# Patient Record
Sex: Female | Born: 1945 | Race: White | Hispanic: No | Marital: Single | Smoking: Never smoker
Health system: Southern US, Community
[De-identification: ages and names within clinical notes are randomized; demographics above are authoritative.]

## PROBLEM LIST (undated history)

## (undated) DIAGNOSIS — F419 Anxiety disorder, unspecified: Secondary | ICD-10-CM

## (undated) DIAGNOSIS — K259 Gastric ulcer, unspecified as acute or chronic, without hemorrhage or perforation: Secondary | ICD-10-CM

## (undated) DIAGNOSIS — F29 Unspecified psychosis not due to a substance or known physiological condition: Secondary | ICD-10-CM

## (undated) DIAGNOSIS — J45909 Unspecified asthma, uncomplicated: Secondary | ICD-10-CM

## (undated) HISTORY — PX: STOMACH SURGERY: SHX791

## (undated) HISTORY — PX: FOOT SURGERY: SHX648

## (undated) HISTORY — PX: BUNIONECTOMY: SHX129

## (undated) HISTORY — PX: ABDOMINAL SURGERY: SHX537

## (undated) HISTORY — PX: HAMMER TOE SURGERY: SHX385

## (undated) HISTORY — PX: ABDOMINAL HYSTERECTOMY: SHX81

---

## 2003-06-28 DIAGNOSIS — F29 Unspecified psychosis not due to a substance or known physiological condition: Secondary | ICD-10-CM

## 2003-06-28 HISTORY — DX: Unspecified psychosis not due to a substance or known physiological condition: F29

## 2003-10-22 ENCOUNTER — Inpatient Hospital Stay (HOSPITAL_COMMUNITY): Admission: EM | Admit: 2003-10-22 | Discharge: 2003-11-23 | Payer: Self-pay | Admitting: Emergency Medicine

## 2003-10-23 ENCOUNTER — Encounter (INDEPENDENT_AMBULATORY_CARE_PROVIDER_SITE_OTHER): Payer: Self-pay | Admitting: *Deleted

## 2003-10-24 ENCOUNTER — Encounter (INDEPENDENT_AMBULATORY_CARE_PROVIDER_SITE_OTHER): Payer: Self-pay | Admitting: *Deleted

## 2003-11-23 ENCOUNTER — Inpatient Hospital Stay (HOSPITAL_COMMUNITY): Admission: AD | Admit: 2003-11-23 | Discharge: 2003-11-27 | Payer: Self-pay | Admitting: Psychiatry

## 2003-12-09 ENCOUNTER — Encounter (INDEPENDENT_AMBULATORY_CARE_PROVIDER_SITE_OTHER): Payer: Self-pay | Admitting: *Deleted

## 2003-12-19 ENCOUNTER — Encounter (INDEPENDENT_AMBULATORY_CARE_PROVIDER_SITE_OTHER): Payer: Self-pay | Admitting: *Deleted

## 2004-01-26 ENCOUNTER — Ambulatory Visit (HOSPITAL_COMMUNITY): Admission: RE | Admit: 2004-01-26 | Discharge: 2004-01-26 | Payer: Self-pay | Admitting: Gastroenterology

## 2004-01-26 ENCOUNTER — Encounter (INDEPENDENT_AMBULATORY_CARE_PROVIDER_SITE_OTHER): Payer: Self-pay | Admitting: *Deleted

## 2005-06-15 IMAGING — CT CT PELVIS W/ CM
1 of 4 series · 14 of 32 positions shown, 19 images · IV contrast (omnipaque)
Comparison: none

CLINICAL DATA: 67 year-old left upper quadrant abdominal pain.
 CT ABDOMEN AND PELVIS
 Helical CT scan of the abdomen and pelvis is performed after bolus infusion of a total of 150 cc Omnipaque 300 and the use of dilute oral contrast.
 The lung bases are clear. There is a small Bochdalek type hernia noted on the left side posteriorly.
 ABDOMEN
 There is marked distention of the stomach.  There are clips near the GE junction and I suspect the patient has had a Nissen fundoplication. There is marked inflammatory change involving the duodenum and I suspect there is an ulcer with inflammation causing gastric outlet obstruction.  The pancreas appears normal and the second and third portion of the duodenum appears normal.  The liver and spleen are normal in appearance.  The adrenal glands and kidneys are normal. The aorta is normal in caliber and the major branch vessels are intact.  The gallbladder appears normal. No mesenteric or retroperitoneal masses or adenopathy. Small bowel and colon are grossly normal.
 IMPRESSION
 Marked distention of the stomach with inflammatory change surrounding the region of the duodenal bulb and descending duodenum. The findings are suspicious for an ulcer with inflammation causing gastric outlet obstruction.  No findings to suggest a perforation.
 PELVIS
 The patient has had a hysterectomy.  There is scattered diverticula involving the colon.  No significant pelvic findings.
 Unremarkable CT pelvis.

[Series 2: abd/pelvis 5.0 b30f · axial · 0.61mm/px · z∈[-444,-34]mm · 14 of 94 slices shown, 19 images]
[im 6/94  soft-tissue]
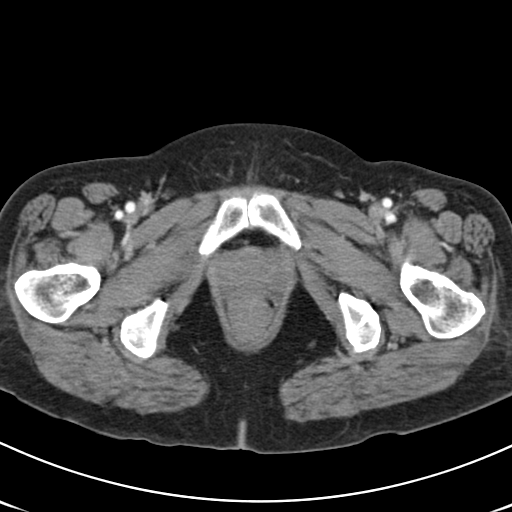
[im 6/94  bone]
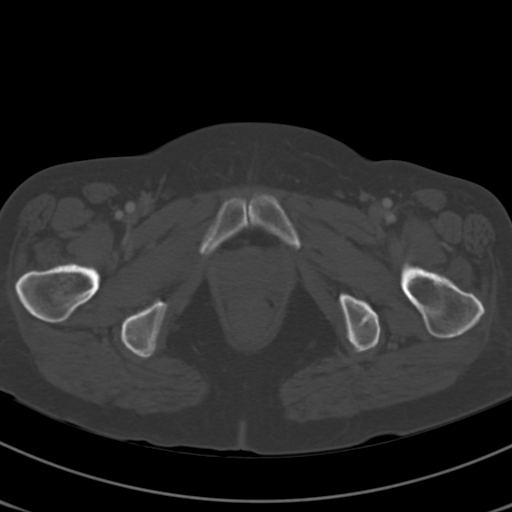
[im 11/94  soft-tissue]
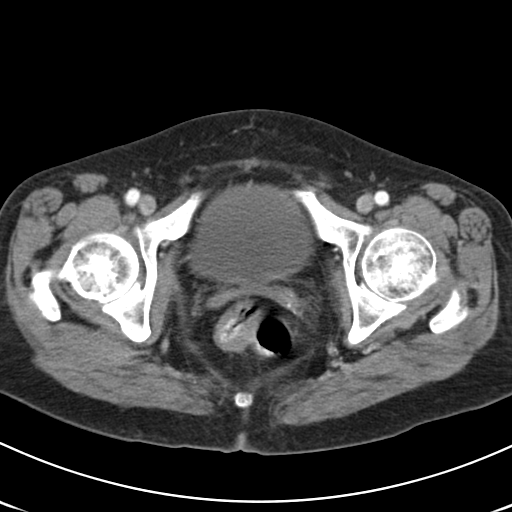
[im 22/94  soft-tissue]
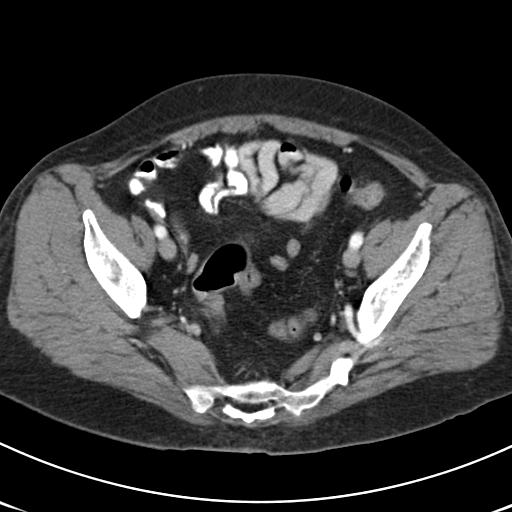
[im 28/94  soft-tissue]
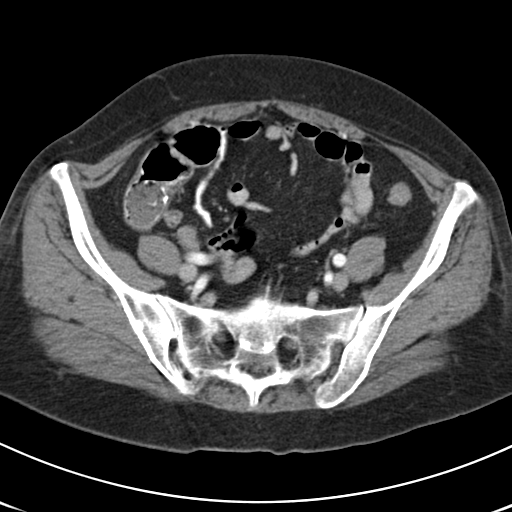
[im 33/94  soft-tissue]
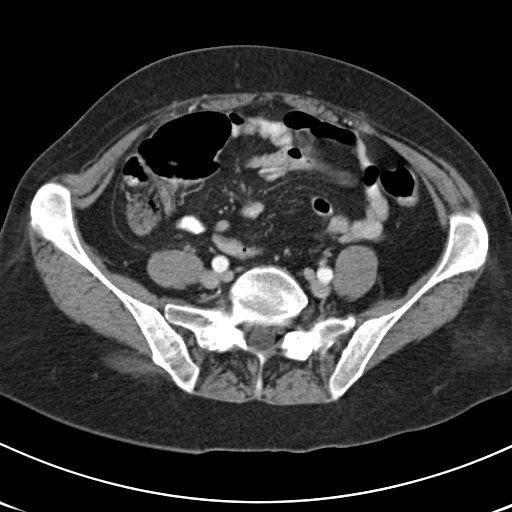
[im 39/94  soft-tissue]
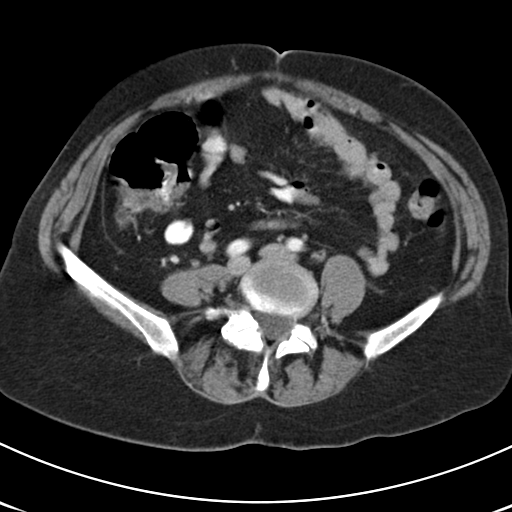
[im 50/94  soft-tissue]
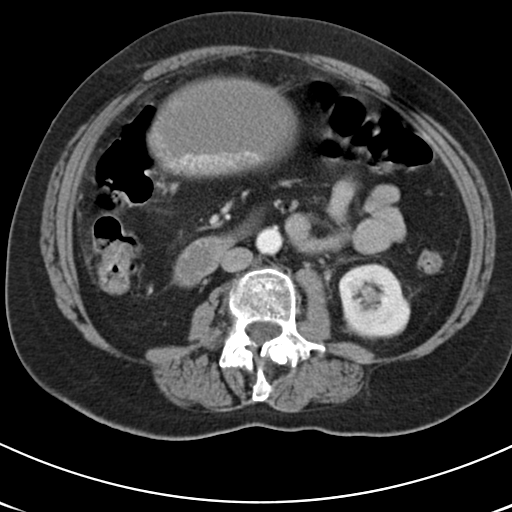
[im 55/94  soft-tissue]
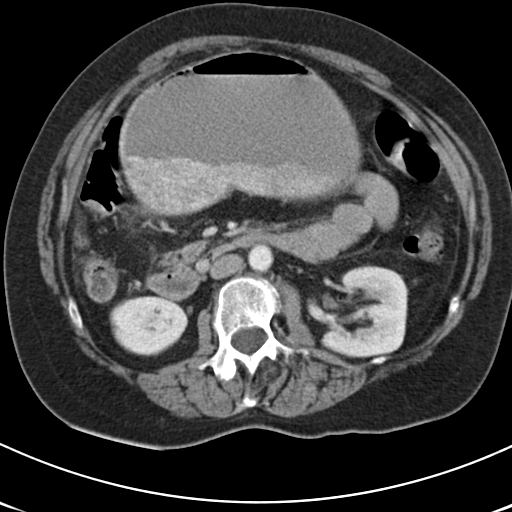
[im 61/94  soft-tissue]
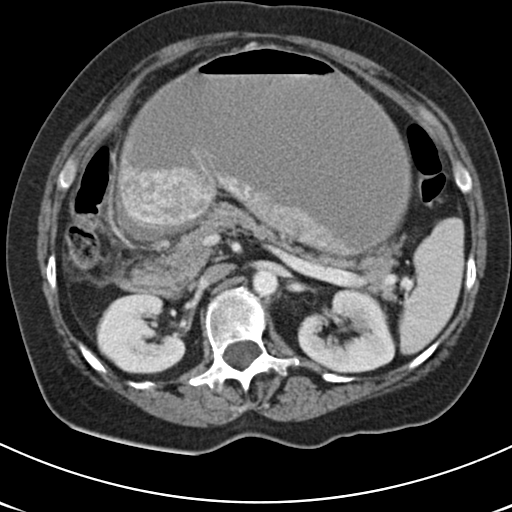
[im 61/94  bone]
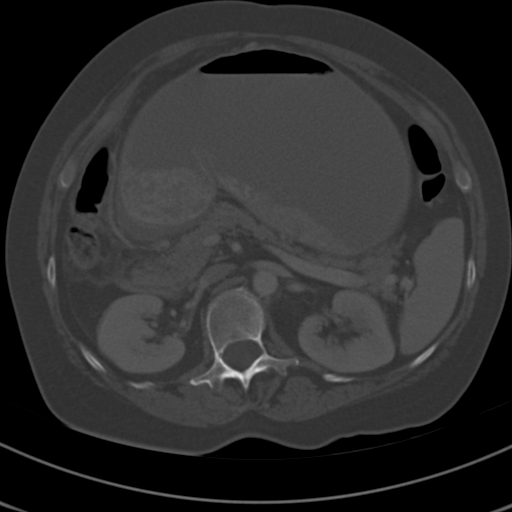
[im 66/94  soft-tissue]
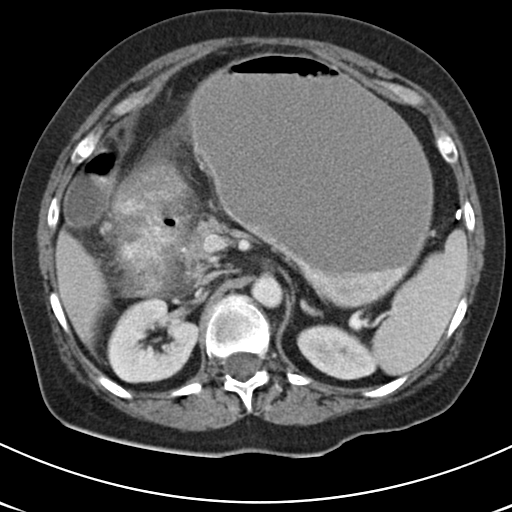
[im 72/94  soft-tissue]
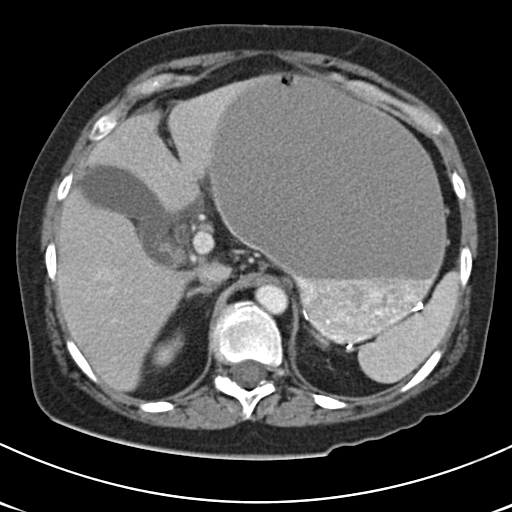
[im 72/94  lung]
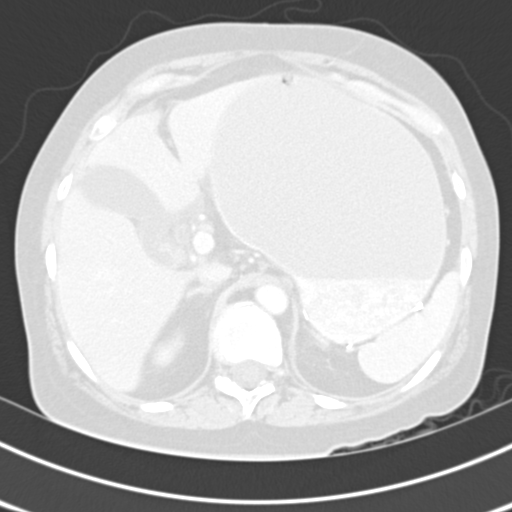
[im 77/94  lung]
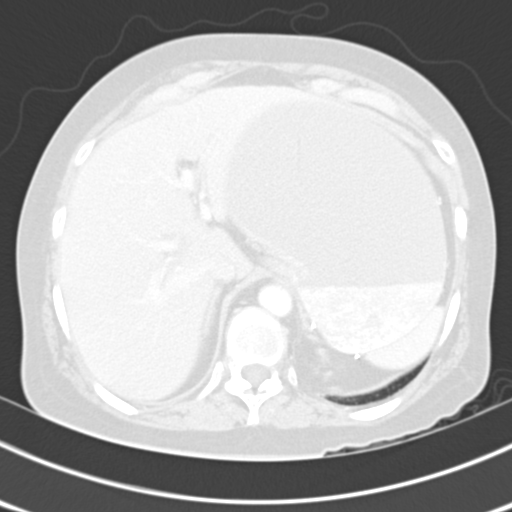
[im 83/94  soft-tissue]
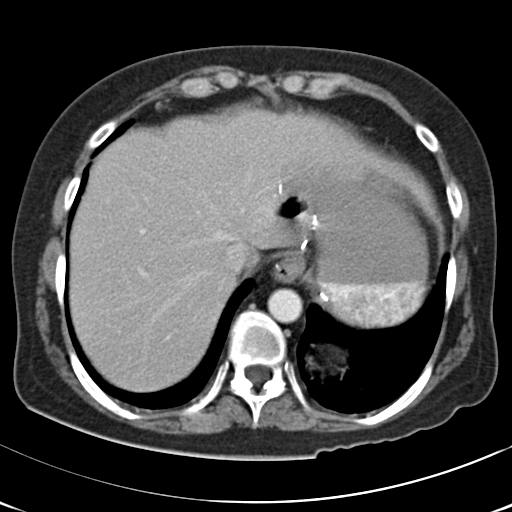
[im 83/94  lung]
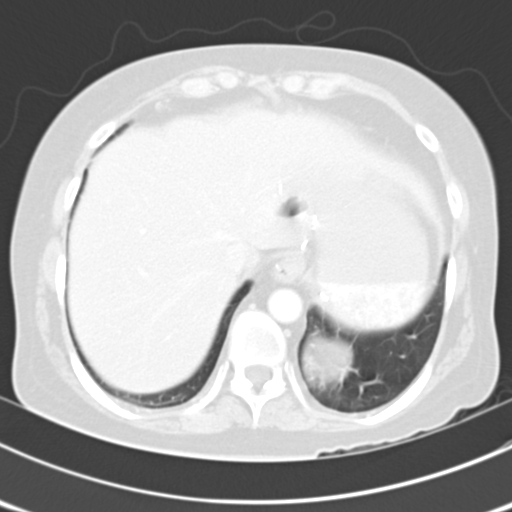
[im 88/94  soft-tissue]
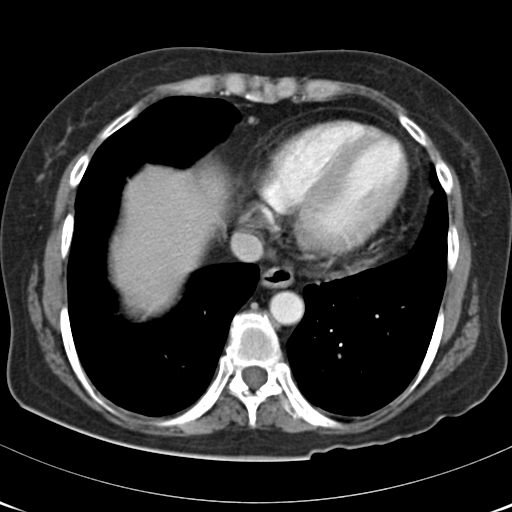
[im 88/94  lung]
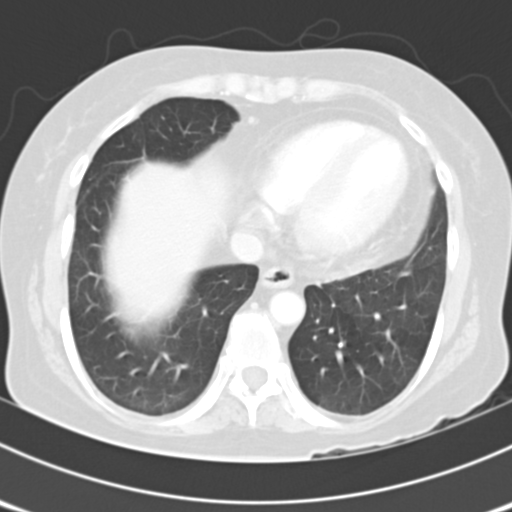

[14 of 32 positions shown; findings below may reference images not displayed]

## 2008-12-02 ENCOUNTER — Telehealth: Payer: Self-pay | Admitting: Internal Medicine

## 2009-07-08 ENCOUNTER — Ambulatory Visit (HOSPITAL_COMMUNITY): Admission: RE | Admit: 2009-07-08 | Discharge: 2009-07-08 | Payer: Self-pay | Admitting: General Practice

## 2010-11-09 ENCOUNTER — Other Ambulatory Visit (HOSPITAL_COMMUNITY): Payer: Self-pay | Admitting: Obstetrics and Gynecology

## 2010-11-09 DIAGNOSIS — Z1231 Encounter for screening mammogram for malignant neoplasm of breast: Secondary | ICD-10-CM

## 2010-11-09 DIAGNOSIS — Z13820 Encounter for screening for osteoporosis: Secondary | ICD-10-CM

## 2010-11-12 NOTE — Discharge Summary (Signed)
NAMESHAMICKA, INGA                           ACCOUNT NO.:  [id number]   MEDICAL RECORD NO.:  [id number]                   PATIENT TYPE:  INP   LOCATION:  0457                                 FACILITY:  [facility]   PHYSICIAN:  Velora Heckler, M.D.                DATE OF BIRTH:  10/20/1945   DATE OF ADMISSION:  10/22/2003  DATE OF DISCHARGE:  11/23/2003                                 DISCHARGE SUMMARY   REASON FOR ADMISSION:  Nausea, vomiting, abdominal pain.   BRIEF HISTORY:  Patient is a 65 year old white female admitted on October 22, 2003 on the medical service of Dr. Derenda Mis for nausea, vomiting, and  abdominal pain.  Patient had a three-day history of emesis.  She was unable  to tolerate clear liquids.  She was admitted on the medical service.  Radiograph studies showed gastric outlet obstruction and a large acute  duodenal ulcer.  Patient was seen by Dr. Carman Ching and taken to  endoscopy.  She was found to have a massive duodenal ulcer.  Post procedure,  the patient had abdominal distention and increased abdominal pain.  Plain  abdominal x-rays showed a large amount of free air.  General surgery was  consulted, and the patient was prepared for the operating room.   HOSPITAL COURSE:  The patient was taken to the operating room on the evening  of October 24, 2003.  She was found to have a perforated giant duodenal ulcer  and gastric outlet obstruction.  She underwent exploratory laparotomy with  lysis of adhesions.  She had a pyloric exclusion performed with a staple  line across the antrum.  She underwent gastrojejunostomy.  She had Cheree Ditto  patch closure of the duodenal ulcer.   Postoperative course was complicated by a persistent leak at the site of the  duodenal ulcer.  Patient required nasogastric decompression and prolonged  n.p.o. status with administration of parenteral hyperalimentation.  She  received a prolonged course of IV antibiotics.  She was followed closely  by  the medical service as well as general surgery.  Due to some delusion  ideation and frank psychosis, patient was seen in consultation by Dr.  Jeanie Sewer from the psychiatric service.   The patient made steady progress.  Her drains were eventually removed.  She  did develop a wound infection which required opening of the abdominal  incision and an open packing with dressing changes.  The patient was  followed with a CT scan of the abdomen.  She developed a recurrence of a  fluid collection in the upper abdomen that required percutaneous drainage by  the radiologist.  This led to marked improvement of her abdominal pain and  normalization of her white blood cell count.  She continued on a course of  IV Cipro and Flagyl until removal of all drains was completed.  The patient  continued to make steady progress.  She was ambulatory.  She tolerated a  diet.  She was discharged on Nov 23, 2003 to the inpatient psychiatry  service with involuntary commitment papers by Dr. Jeanie Sewer.  She was taken  to [facility] on Sunday, Nov 23, 2003.   DISCHARGE PLAN:  Patient is admitted for inpatient psychiatric care at  [facility] on Nov 23, 2003.  Discharge medications including Protonix  40 mg b.i.d., Vicodin as needed for pain, Cipro, and Flagyl for one week.  Patient will be seen back at my office at [facility] in two  weeks.   FINAL DIAGNOSIS:  Perforated giant duodenal ulcer, gastric outlet  obstruction.   CONDITION ON DISCHARGE:  Improved.                                               Velora Heckler, M.D.    TMG/MEDQ  D:  12/19/2003  T:  12/19/2003  Job:  [id number]
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
## 2010-11-12 NOTE — Op Note (Signed)
Molly [name], Molly [name]                           ACCOUNT NO.:  [id number]   MEDICAL RECORD NO.:  [id number]                   PATIENT TYPE:  INP   LOCATION:  0457                                 FACILITY:  [facility]   PHYSICIAN:  James L. Malon Kindle., M.D.          DATE OF BIRTH:  1946-04-12   DATE OF PROCEDURE:  10/24/2003  DATE OF DISCHARGE:                                 OPERATIVE REPORT   PROCEDURE:  Esophagogastroduodenoscopy with biopsy.   INDICATIONS FOR PROCEDURE:  Nausea and vomiting with current ulcer with  outlet obstruction on CT.   DESCRIPTION OF PROCEDURE:  The procedure had been explained to the patient  and consent obtained.  The patient had an NG tube and has been undergoing NG  suction for several days.  This procedure is done to look for a source of  her outlet obstruction.   DESCRIPTION OF PROCEDURE:  The procedure had been explained to the patient  and consent obtained. The scope was inserted, I elected to leave the NG tube  in place and followed it down, entered the stomach. A large amount of food  material was still in the stomach. We identified the gastric outlet and  looking through the gastric outlet there was food material and a large  ulcer. I did irrigate this some and photographed it. I did not try to push  through this.  We reviewed this only in the key hole position through the  pylorus. This ulcer was massive and seemed to involve the entire anterior  duodenal wall. The scope was withdrawn back in the stomach, a biopsy was  taken for rapid urase test for helicobacter.  The scope was withdrawn, the  NG tube was left in place. The stomach could not otherwise be examined due  to a large amount of liquid food material that was suctioned as much as  possible. The distal and proximal esophagus were endoscopically normal.  The  patient tolerated the procedure relatively well and was resting comfortably  at the termination of the procedure.   ASSESSMENT:   Massive duodenal ulcer with gastric outlet obstruction.   PLAN:  Will continue NG suction and IV PPI's, possibly an upper GI series in  the next several days.                                               James L. Malon Kindle., M.D.    [name]  D:  10/24/2003  T:  10/24/2003  Job:  [id number]   cc:   Sammuel Bailiff, M.D.  [facility] [employer] [facility]

## 2010-11-12 NOTE — Op Note (Signed)
Molly [name], Molly [name]                           ACCOUNT NO.:  [id number]   MEDICAL RECORD NO.:  [id number]                   PATIENT TYPE:  INP   LOCATION:  0457                                 FACILITY:  [facility]   PHYSICIAN:  Velora Heckler, M.D.                DATE OF BIRTH:  [birth year]   DATE OF PROCEDURE:  10/24/2003  DATE OF DISCHARGE:                                 OPERATIVE REPORT   PREOPERATIVE DIAGNOSES:  1. Perforated giant duodenal ulcer.  2. Gastric outlet obstruction.   POSTOPERATIVE DIAGNOSES:  1. Perforated giant duodenal ulcer.  2. Gastric outlet obstruction.   PROCEDURES:  1. Exploratory laparotomy with lysis of adhesions.  2. Pyloric exclusion.  3. Gastrojejunostomy.  4. Graham patch closure, duodenal ulcer.   SURGEON:  Velora Heckler, M.D.   ASSISTANT:  Leonie Man, M.D.   ANESTHESIA:  General.   ESTIMATED BLOOD LOSS:  100 mL.   PREPARATION:  Betadine.   COMPLICATIONS:  None.   INDICATIONS:  The patient is a 65 year old white female, admitted on the  medical service with abdominal pain, nausea, vomiting.  CT scan showed  massively dilated stomach with gastric outlet obstruction.  The patient was  decompressed and then underwent upper endoscopy by Dr. Carman Ching.  This  was performed on the day of surgery.  Postprocedure, the patient was noted  to have abdominal distention and pain.  Abdominal plain x-rays demonstrate a  large amount of free air in the right upper quadrant consistent with  perforation.  General surgery was consulted, and the patient was prepared  urgently for the operating room.   DESCRIPTION OF PROCEDURE:  The procedure is done in OR #1 at the [facility].  The patient is brought to the operating room and placed  in a supine position on the operating room table.  Following the  administration of general anesthesia, the patient was prepped and draped in  the usual strict aseptic fashion.  After ascertaining  that an adequate level  of anesthesia had been obtained, a midline abdominal incision is made  through the previous scar using a #10 blade.  Dissection is carried through  subcutaneous tissues.  Fascia is incised in the midline, and the peritoneal  cavity is entered cautiously.  Adhesions are lysed extensively from the  anterior abdominal wall.  A Balfour retractor is placed for exposure.  Small  bowel adhesions are lysed from ligament of Treitz to ileocecal valve.  There  is a copious amount of cloudy fluid throughout the peritoneal cavity and  especially in the pelvis.  This was evacuated.  Upper abdomen is explored.  The falciform ligament is taken down.  Adhesions are lysed along the liver  to expose the stomach and pylorus.  Gallbladder is distended.  Liver appears  grossly normal.  Exploration of the pyloric channel shows a large ulcer  which is perforated  with a maximum diameter of approximately 2.5 cm.  There  is vegetative material spilled in Morison's pouch.  This is extracted.  There is a fibrinous exudate coating Morison's pouch and the undersurface of  the liver and the duodenal sweep.  This is gently debrided.  The duodenal C-  loop is markedly inflamed and encased in inflammatory tissue.  The ulcer  itself is markedly thick-walled and friable.  Pylorus appears to be  hypertrophic, dense, with a dense inflammatory reaction surrounding it.  Decision is made not to perform simply patch closure.  Pyloroplasty does not  appear possible due to the inflammatory nature of the tissues.  Therefore, a  decision is made to proceed with pyloric exclusion and gastrojejunostomy for  treatment of perforated giant duodenal ulcer and gastric outlet obstruction.  The gastrocolic ligament is incised.  Vessels are taken down along the  greater curvature of the stomach towards the pylorus between Kelly clamps  and ligated with 2-0 silk ties.  This exposes the posterior aspect of the  stomach.  The  dissection is carried down to near the pylorus.  Using a TA 90  stapler with 4.8 mm staples, a staple line is placed across the distal  antrum approximately 3 cm proximal to the pylorus.  This appears to occlude  the distal antrum completely.  Next, an opening is made in the mesocolon,  and a loop of proximal jejunum approximately 40-50 cm from the ligament of  Treitz is brought retrocolic into the upper abdomen.  A gastrojejunostomy is  then created between the posterior wall of the dependent portion of the  stomach and the jejunum.  This was performed with a GIA stapler.  Staple  line is inspected for hemostasis.  Enterotomy is closed with the TA 90  stapler using a 3.5 mm staple cartridge.  Staple line is oversewn with  interrupted 2-0 silk Lembert sutures.  Good hemostasis was noted.  The  efferent and afferent limbs appear to be widely patent.  Next, the abdomen  was irrigated copiously with warm saline.  This was evacuated.  Two 10  [place] Blake drains are brought in through stab wounds in the right upper  quadrant of the abdominal wall.  The lateral drain is placed into Morison's  pouch behind the ulcer.  Next, the ulcer is closed with a Cheree Ditto patch  closure using 2-0 silk sutures through the ulcer wall, securing omentum over  the ulcer cavity.  The second drain is then placed anterior to the [facility]  patch closure beneath the edge of the liver.  Drains are secured to the skin  with 3-0 nylon sutures.  All packs are removed.  Good hemostasis is noted.  Midline wound is closed with interrupted #1 Novofil figure-of-eight sutures.  Subcutaneous tissues are irrigated.  Skin is closed with stainless steel  staples.  Sterile dressings are applied.  The patient is awakened from  anesthesia and brought to the recovery room in stable condition.  The  patient tolerated the procedure well.                                              Velora Heckler, M.D.    TMG/MEDQ  D:  10/24/2003  T:   10/24/2003  Job:  [id number]   cc:   Sherin Quarry, MD   Schuyler Amor, M.D.  [phone]  [place], [place] [address]  Fax: [phone]
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
## 2010-11-12 NOTE — Consult Note (Signed)
NAMESHANTAE, VANTOL                           ACCOUNT NO.:  [id number]   MEDICAL RECORD NO.:  [id number]                   PATIENT TYPE:  INP   LOCATION:  0457                                 FACILITY:  [facility]   PHYSICIAN:  Velora Heckler, M.D.                DATE OF BIRTH:  04-Mar-1946   DATE OF CONSULTATION:  10/24/2003  DATE OF DISCHARGE:                                   CONSULTATION   CONSULTING PHYSICIAN:  Velora Heckler, M.D.   REFERRING PHYSICIAN:  Sherin Quarry, M.D. of [facility].   REASON FOR CONSULTATION:  Acute abdomen, perforated duodenal ulcer, gastric  outlet obstruction.   HISTORY OF PRESENT ILLNESS:  The patient is a 65 year old white female who  was admitted on October 22, 2003 on the medical service by Dr. Derenda Mis  for nausea, vomiting and abdominal pain. The patient had a three day history  of vomiting.  She had an inability to tolerate clear liquids.  Despite  multiple home therapeutic remedies, she had no relief.  She was admitted on  the medical service.  Radiographic studies demonstrated findings consistent  with gastric outlet obstruction and a large acute duodenal ulcer.  The  patient was seen in consultation by gastroenterology.  On April 29, she was  taken to the endoscopy suite where she underwent upper endoscopy by Dr.  Carman Ching.  She was found to have a massive duodenal ulcer.  Post  procedure she was noted to have increased abdominal pain and abdominal  distention.  Plain abdominal x-rays showed a moderate to large amount of  free air in the right upper quadrant of the abdomen consistent with  perforation.  The patient is now seen by general surgery for management of  acute perforation of duodenal ulcer.   PAST MEDICAL HISTORY:  1. Status post open Nissen fundoplication by Dr. Wenda Low in     approximately 1990.  2. History of asthma.   HOME MEDICATIONS:  1. Stool softeners.  2. Advair.  3. Albuterol.  4. Atrovent.  5.  Singulair.  6. Xanax.   ALLERGIES:  1. PENICILLIN.  2. SULFA.  3. TOPICAL COCAINE.   SOCIAL HISTORY:  The patient is not married.  She lives in [place].  She  works as a Conservation officer, nature at [employer].  She denies tobacco use.  She  denies alcohol use.   FAMILY HISTORY:  Notable for diabetes in the patient's father.  History of  cancer of unknown type in the patient's mother.   REVIEW OF SYMPTOMS:  The fifteen system review is without significant other  positives.  GI:  The patient denies any previous history of ulcer disease.  She denies any GI bleeding symptoms. She denies any previous abdominal pain.   PHYSICAL EXAMINATION:  GENERAL:  [age]-year-old awake white female on  [address] at [employer].  VITAL SIGNS:  Temperature  is 100.7, temperature maximum 101.0, pulse 92,  respirations 32, blood pressure 144/89.  HEENT:  Shows her to be normocephalic, atraumatic.  Sclerae are clear.  Conjunctivae are clear.  Pupils are equal and reactive.  Dentition is fair.  A nasogastric tube is in the right nares.  Palpation of the neck shows the  thyroid to be normal without nodularity.  There is no anterior or posterior  cervical lymphadenopathy.  There are no supraclavicular masses.  LUNGS:  Clear to auscultation without rales or wheezes.  CARDIAC:  Mild tachycardia. There is no murmur.  Peripheral pulses are full.  ABDOMEN:  Soft, moderately distended. On auscultation there are no bowel  sounds.  There is diffuse abdominal tenderness both to palpation and  percussion.  There is rebound tenderness.  There is voluntary guarding.  There is a well healed upper midline surgical wound without sign of  herniation.  There are no palpable masses.  EXTREMITIES: Non-tender without edema.  No evidence of recent trauma and no  ecchymosis.  NEUROLOGIC:  The patient is alert and oriented to person, place and time  without focal deficit.   LABORATORY DATA:  White count 12.6,  hemoglobin 10.5, platelets 336,000.  Electrolytes are normal.   RADIOGRAPHIC STUDIES:  CT scan of the abdomen and pelvis as well as today's  plain abdominal x-rays are reviewed with Dr. Loralie Champagne in the radiology  department.   IMPRESSION:  1. Perforated duodenal ulcer following upper endoscopy.  2. Gastric outlet obstruction.  3. History of anti-reflux procedure.  4. History of asthma.   PLAN:  1. Immediate preparation for operating room for urgent laparotomy.  2. Intravenous antibiotics per medical service.  3. NPO, nasogastric decompression.   The patient will require an exploratory laparotomy for an acute abdomen with  perforation and a large amount of free air.  Given the appearance of the  duodenum on both endoscopy and CT scan, it is doubtful that the patient can  have  a simple Cheree Ditto patch procedure.  It is also doubtful that a pyloroplasty  will be possible.  Therefore, the patient will likely require pyloric  exclusion with gastrojejunostomy.  I have discussed this both with Dr. Sherin Quarry and with the patient.  She understands and wishes to proceed.                                               Velora Heckler, M.D.    TMG/MEDQ  D:  10/24/2003  T:  10/24/2003  Job:  [id number]   cc:   Sherin Quarry, MD   Llana Aliment Malon Kindle., M.D.  [address]  [place]  [place] [address]  Fax: [phone]

## 2010-11-12 NOTE — Op Note (Signed)
NAMEROCKELLE, HEUERMAN                           ACCOUNT NO.:  [id number]   MEDICAL RECORD NO.:  [id number]                   PATIENT TYPE:  AMB   LOCATION:  ENDO                                 FACILITY:  [facility]   PHYSICIAN:  James L. Malon Kindle., M.D.          DATE OF BIRTH:  1945-07-27   DATE OF PROCEDURE:  01/26/2004  DATE OF DISCHARGE:                                 OPERATIVE REPORT   PROCEDURE:  Esophagogastroduodenoscopy.   MEDICATIONS:  1. Cetacaine spray.  2. Fentanyl 75 mcg.  3. Versed 7 mg IV.   INDICATION:  The patient came in with upper abdominal pain and had endoscopy  showing a perforated ulcer and underwent repair of this, had peritonitis,  had a long, rocky hospital course including a brief hospitalization at  [facility], has gradually improved, but she is still having  epigastric pain.  For this reason, an endoscopy is performed to see if there  is any active ulceration.   DESCRIPTION OF PROCEDURE:  The procedure had been explained to the patient  and consent obtained.  In the left lateral decubitus position, the scope was  inserted and advanced with agglutination.  The esophagus was entered, the  scope passed down in the stomach.  The gastric outlet was identified and was  completely socked in and obstructed.  There was really no opening at all  with insufflation of air.  The stomach was examined and along the greater  curve was a Roux-en-Y loop.  This was entered, and the limb was normal.  There was no ulceration.  There was marked erosions and inflammation around  in the stomach.  No active ulceration.  The fundus and cardia were seen well  under retroflexed view and were normal.  The scope was withdrawn.  The  distal and proximal esophagus are endoscopically normal.  The scope was  withdrawn, and the patient tolerated the procedure well.   ASSESSMENT:  Gastritis, possibly the cause of abdominal pain.  No active  ulceration to explain the abdominal  pain.  535.00 and 789.06.   PLAN:  We will continue on Protonix, see back in the office in 6-8 weeks.                                               James L. Malon Kindle., M.D.    [name]  D:  01/26/2004  T:  01/27/2004  Job:  [id number]   cc:   Velora Heckler, M.D.  [address]  [place]  [place] [address]  Fax: [phone]   Schuyler Amor, M.D.  [address]  [place], [place] [address]  Fax: [phone]
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
## 2010-11-18 ENCOUNTER — Ambulatory Visit (HOSPITAL_COMMUNITY)
Admission: RE | Admit: 2010-11-18 | Discharge: 2010-11-18 | Disposition: A | Discharge status: Home / Self Care | Payer: 59 | Source: Ambulatory Visit | Attending: Obstetrics and Gynecology | Admitting: Obstetrics and Gynecology

## 2010-11-18 ENCOUNTER — Other Ambulatory Visit (HOSPITAL_COMMUNITY): Payer: Self-pay | Admitting: Obstetrics and Gynecology

## 2010-11-18 DIAGNOSIS — Z13820 Encounter for screening for osteoporosis: Secondary | ICD-10-CM | POA: Insufficient documentation

## 2010-11-18 DIAGNOSIS — Z1231 Encounter for screening mammogram for malignant neoplasm of breast: Secondary | ICD-10-CM

## 2010-11-18 DIAGNOSIS — Z780 Asymptomatic menopausal state: Secondary | ICD-10-CM | POA: Insufficient documentation

## 2013-01-04 ENCOUNTER — Encounter (HOSPITAL_COMMUNITY): Payer: Self-pay | Admitting: Emergency Medicine

## 2013-01-04 ENCOUNTER — Emergency Department (HOSPITAL_COMMUNITY)
Admission: EM | Admit: 2013-01-04 | Discharge: 2013-01-04 | Discharge status: Against Medical Advice | Payer: Medicare Other | Source: Home / Self Care

## 2013-01-04 HISTORY — DX: Unspecified asthma, uncomplicated: J45.909

## 2013-01-04 HISTORY — DX: Gastric ulcer, unspecified as acute or chronic, without hemorrhage or perforation: K25.9

## 2013-01-04 NOTE — ED Notes (Signed)
Pt reports she is out of her Albuterol and feels tightness is her chest. Mild wheezing. Last breathing tx was last night. Usually does 2-3 per day. Just turned 67 and switched to Medicare and her PCP no longer accepts Medicare. Needs a refill until she establishes care with a new PCP. Patient is alert and oriented.

## 2013-04-18 ENCOUNTER — Other Ambulatory Visit (HOSPITAL_COMMUNITY): Payer: Self-pay | Admitting: *Deleted

## 2013-04-18 DIAGNOSIS — Z0000 Encounter for general adult medical examination without abnormal findings: Secondary | ICD-10-CM

## 2013-05-08 ENCOUNTER — Ambulatory Visit (HOSPITAL_COMMUNITY)
Admission: RE | Admit: 2013-05-08 | Discharge: 2013-05-08 | Disposition: A | Discharge status: Home / Self Care | Payer: Medicare Other | Source: Ambulatory Visit | Attending: *Deleted | Admitting: *Deleted

## 2013-05-08 DIAGNOSIS — Z1231 Encounter for screening mammogram for malignant neoplasm of breast: Secondary | ICD-10-CM | POA: Insufficient documentation

## 2013-05-08 DIAGNOSIS — Z0000 Encounter for general adult medical examination without abnormal findings: Secondary | ICD-10-CM

## 2013-07-25 ENCOUNTER — Encounter (HOSPITAL_COMMUNITY): Payer: Self-pay | Admitting: Emergency Medicine

## 2013-07-25 ENCOUNTER — Emergency Department (HOSPITAL_COMMUNITY)
Admission: EM | Admit: 2013-07-25 | Discharge: 2013-07-26 | Disposition: A | Discharge status: Other Acute Inpatient Hospital | Payer: Medicare Other | Attending: Emergency Medicine | Admitting: Emergency Medicine

## 2013-07-25 DIAGNOSIS — J45909 Unspecified asthma, uncomplicated: Secondary | ICD-10-CM | POA: Insufficient documentation

## 2013-07-25 DIAGNOSIS — Z79899 Other long term (current) drug therapy: Secondary | ICD-10-CM | POA: Insufficient documentation

## 2013-07-25 DIAGNOSIS — Z8711 Personal history of peptic ulcer disease: Secondary | ICD-10-CM | POA: Insufficient documentation

## 2013-07-25 DIAGNOSIS — F411 Generalized anxiety disorder: Secondary | ICD-10-CM | POA: Insufficient documentation

## 2013-07-25 DIAGNOSIS — IMO0002 Reserved for concepts with insufficient information to code with codable children: Secondary | ICD-10-CM | POA: Insufficient documentation

## 2013-07-25 DIAGNOSIS — F22 Delusional disorders: Secondary | ICD-10-CM | POA: Insufficient documentation

## 2013-07-25 DIAGNOSIS — F29 Unspecified psychosis not due to a substance or known physiological condition: Secondary | ICD-10-CM | POA: Insufficient documentation

## 2013-07-25 DIAGNOSIS — Z880 Allergy status to penicillin: Secondary | ICD-10-CM | POA: Insufficient documentation

## 2013-07-25 HISTORY — DX: Unspecified psychosis not due to a substance or known physiological condition: F29

## 2013-07-25 HISTORY — DX: Anxiety disorder, unspecified: F41.9

## 2013-07-25 LAB — CBC WITH DIFFERENTIAL/PLATELET
BASOS ABS: 0 10*3/uL (ref 0.0–0.1)
Basophils Relative: 0 % (ref 0–1)
EOS ABS: 0.1 10*3/uL (ref 0.0–0.7)
EOS PCT: 1 % (ref 0–5)
HCT: 38 % (ref 36.0–46.0)
Hemoglobin: 12.4 g/dL (ref 12.0–15.0)
Lymphocytes Relative: 13 % (ref 12–46)
Lymphs Abs: 1 10*3/uL (ref 0.7–4.0)
MCH: 29.8 pg (ref 26.0–34.0)
MCHC: 32.6 g/dL (ref 30.0–36.0)
MCV: 91.3 fL (ref 78.0–100.0)
Monocytes Absolute: 0.4 10*3/uL (ref 0.1–1.0)
Monocytes Relative: 6 % (ref 3–12)
Neutro Abs: 6.3 10*3/uL (ref 1.7–7.7)
Neutrophils Relative %: 81 % — ABNORMAL HIGH (ref 43–77)
PLATELETS: 331 10*3/uL (ref 150–400)
RBC: 4.16 MIL/uL (ref 3.87–5.11)
RDW: 12.7 % (ref 11.5–15.5)
WBC: 7.8 10*3/uL (ref 4.0–10.5)

## 2013-07-25 LAB — RAPID URINE DRUG SCREEN, HOSP PERFORMED
Amphetamines: NOT DETECTED
Barbiturates: NOT DETECTED
Benzodiazepines: NOT DETECTED
Cocaine: NOT DETECTED
Opiates: NOT DETECTED
TETRAHYDROCANNABINOL: NOT DETECTED

## 2013-07-25 LAB — URINALYSIS, ROUTINE W REFLEX MICROSCOPIC
BILIRUBIN URINE: NEGATIVE
GLUCOSE, UA: NEGATIVE mg/dL
Hgb urine dipstick: NEGATIVE
Ketones, ur: NEGATIVE mg/dL
Leukocytes, UA: NEGATIVE
Nitrite: NEGATIVE
PH: 6 (ref 5.0–8.0)
Protein, ur: NEGATIVE mg/dL
Specific Gravity, Urine: 1.015 (ref 1.005–1.030)
UROBILINOGEN UA: 0.2 mg/dL (ref 0.0–1.0)

## 2013-07-25 LAB — BASIC METABOLIC PANEL
BUN: 8 mg/dL (ref 6–23)
CALCIUM: 9.6 mg/dL (ref 8.4–10.5)
CO2: 27 mEq/L (ref 19–32)
Chloride: 101 mEq/L (ref 96–112)
Creatinine, Ser: 0.6 mg/dL (ref 0.50–1.10)
GLUCOSE: 115 mg/dL — AB (ref 70–99)
POTASSIUM: 4 meq/L (ref 3.7–5.3)
SODIUM: 141 meq/L (ref 137–147)

## 2013-07-25 LAB — ETHANOL

## 2013-07-25 MED ORDER — HYDROCHLOROTHIAZIDE 12.5 MG PO CAPS
12.5000 mg | ORAL_CAPSULE | Freq: Every day | ORAL | Status: DC
  Administered 2013-07-26: 12.5 mg via ORAL
  Filled 2013-07-25 (×3): qty 1

## 2013-07-25 MED ORDER — FLUTICASONE PROPIONATE HFA 44 MCG/ACT IN AERO
2.0000 | INHALATION_SPRAY | Freq: Two times a day (BID) | RESPIRATORY_TRACT | Status: DC
  Administered 2013-07-26: 2 via RESPIRATORY_TRACT
  Filled 2013-07-25: qty 10.6

## 2013-07-25 MED ORDER — LOSARTAN POTASSIUM 50 MG PO TABS
100.0000 mg | ORAL_TABLET | Freq: Every day | ORAL | Status: DC
  Administered 2013-07-26: 100 mg via ORAL
  Filled 2013-07-25 (×3): qty 2

## 2013-07-25 MED ORDER — LOSARTAN POTASSIUM-HCTZ 100-12.5 MG PO TABS: 1.0000 | ORAL_TABLET | Freq: Every day | ORAL | Status: DC

## 2013-07-25 MED ORDER — ALBUTEROL SULFATE (2.5 MG/3ML) 0.083% IN NEBU
2.5000 mg | INHALATION_SOLUTION | RESPIRATORY_TRACT | Status: DC | PRN
  Administered 2013-07-26: 2.5 mg via RESPIRATORY_TRACT
  Filled 2013-07-25 (×2): qty 3

## 2013-07-25 MED ORDER — ALPRAZOLAM 0.5 MG PO TABS: 0.5000 mg | ORAL_TABLET | Freq: Every evening | ORAL | Status: DC | PRN

## 2013-07-25 MED ORDER — ACETAMINOPHEN 500 MG PO TABS
500.0000 mg | ORAL_TABLET | Freq: Every day | ORAL | Status: DC
Start: 2013-07-25 — End: 2013-07-27

## 2013-07-25 MED ORDER — PANTOPRAZOLE SODIUM 40 MG PO TBEC
40.0000 mg | DELAYED_RELEASE_TABLET | Freq: Every day | ORAL | Status: DC
  Administered 2013-07-26: 40 mg via ORAL
  Filled 2013-07-25: qty 1

## 2013-07-25 MED ORDER — MONTELUKAST SODIUM 10 MG PO TABS
10.0000 mg | ORAL_TABLET | Freq: Every day | ORAL | Status: DC
  Administered 2013-07-26: 10 mg via ORAL
  Filled 2013-07-25: qty 1
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
## 2013-07-25 NOTE — ED Notes (Signed)
Pt states she was sitting her car reading outside her house. Pt is vague when question if she live in the house. Pt is as urgency to go to the bathroom to have BM, states she took a laxative yesterday.
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
## 2013-07-25 NOTE — ED Notes (Signed)
Assumed care of patient at this time. Sitter at the bedside at this time. Labs and urine ordered. Pt changed into paper scrubs. Belongings searched and place in belongings bag and labels and secured in nursing station. Officer at the bedside.
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
## 2013-07-25 NOTE — BH Assessment (Signed)
Spoke with EDP Dr.McManus to obtain clinicals prior to assessing patient.  Jeyli Zwicker, MS, LCASA Assessment Counselor  

## 2013-07-25 NOTE — ED Notes (Signed)
Patient ambulatory to restroom with steady gait with tech, clean catch instructions given and advised pt to bring specimen back to room as well.  

## 2013-07-25 NOTE — BH Assessment (Signed)
Spoke with Molly [name], [facility] disposition tech who will continue seeking gero psych placement and follow-up with [facility].   Molly PeachNajah Jehad Bisono, MS, LCASA Assessment Counselor

## 2013-07-25 NOTE — BH Assessment (Signed)
Spoke with Pam [facility] who reports that they will have discharges in the morning. Pam requested that referral information be faxed to [phone] for placement consideration. TTS faxed patient's labs and [facility] to [facility]. Spoke with Charlynn CourtJohn nursing secretary at [facility] and he agreed to fax pt's IVC commitment paper's to [facility] for review. Pt's nurse Isac CaddySharon Moore,RN was unavailable and busy with a patient and TTS unable to speak with her to update her about pt. EDP Dr. Clarene DukeMcManus is aware of plan.  Follow-up require by TTS or Disposition Team to ensure that [facility] received referral information.   Glorious PeachNajah Willey Due, MS, LCASA Assessment Counselor
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
## 2013-07-26 NOTE — Progress Notes (Signed)
Received pone call from BransonNeal at [facility] requesting IVC documentation and demographics.  Jasmine DecemberSharon from [facility] faxed IVC paper work, requested information faxed to [facility].   Tomi BambergerMariya Sullivan Jacuinde Disposition MHT

## 2013-07-26 NOTE — ED Notes (Signed)
Escorted to bathroom, states she feels like she needs a neb tx.

## 2013-07-26 NOTE — Progress Notes (Signed)
0205 [facility] d/t leg weakness resulting in pt being a "fall risk"   Tomi BambergerMariya Zev Blue Disposition MHT

## 2013-07-26 NOTE — ED Notes (Signed)
[facility] there
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
## 2013-07-26 NOTE — BH Assessment (Addendum)
[facility] Assessment Progress Note Update:  As per shift report, @ 1816, [facility] is ready for pt and has been accepted to Dr. Guss Bundehalla to bed 412A.  Number for nurse to nurse report is [phone].  Pt's nurse, Darel HongJudy, at [facility] notified by TTS.  Called and left a message for Delorise ShinerGrace at [facility] regarding pt acceptance there @ 1825.  TTS will need to follow up with pt disposition.  Casimer LaniusKristen Ashwika Freels, MS, [facility] Licensed Professional Counselor Triage Specialist

## 2013-07-26 NOTE — ED Notes (Signed)
Report called to NamibiaLashanda, Charity fundraiserN at [facility].

## 2013-07-26 NOTE — BH Assessment (Signed)
[facility] Assessment Progress Note  At 18:16 I received a call from [facility].  Pt has been accepted by Dr Guss Bundehalla to Rm 412A, and the facility is now ready to receive the pt.  Please call nurse-to-nurse report to [phone].  At 18:18 I spoke to Darel HongJudy, the [facility] nurse at [facility] and notified her.  Doylene Canninghomas Steadman Prosperi, MA Triage Specialist 07/26/13 @ 18:20

## 2013-07-26 NOTE — Progress Notes (Signed)
Pt has been accepted to [facility] per McCookGrace but can not come until later today after d/c's.   Stated they would call back later to give accepting MD and report number.   Molly BambergerMariya Elwyn [name] Disposition MHT

## 2013-07-26 NOTE — ED Notes (Signed)
Per Evaristo Buryom Huges at [facility], [facility]. Estell HarpinZammit aware

## 2013-07-26 NOTE — Progress Notes (Signed)
The following facilities have been contacted regarding inptx on pt's behalf:  [facility], [facility] per [facility] beds available but would not review until tomorrow, [facility] beds available but will not review until AM, referral faxed [facility]- no answer, referral faxed [facility]- per [facility] bed available, referral faxed [facility]- [facility] but will not review until AM, referral faxed   [facility] Disposition [facility]

## 2013-07-26 NOTE — ED Notes (Signed)
Breakfast given to pt. States she doesn't feel hungry but she will try to eat a little. States she needs a breathing treatment. Expiratory wheezing noted, RT paged for treatment

## 2013-07-26 NOTE — ED Notes (Signed)
Report called to [place], Charity fundraiserN at [place].  Patient calm and cooperative at present.  [employer] notified of need to transport patient.

## 2013-11-11 ENCOUNTER — Ambulatory Visit: Payer: Self-pay | Admitting: Podiatry

## 2013-11-28 ENCOUNTER — Ambulatory Visit (INDEPENDENT_AMBULATORY_CARE_PROVIDER_SITE_OTHER): Payer: Medicare Other

## 2013-11-28 ENCOUNTER — Ambulatory Visit (INDEPENDENT_AMBULATORY_CARE_PROVIDER_SITE_OTHER): Payer: Medicare Other | Admitting: Podiatry

## 2013-11-28 ENCOUNTER — Encounter: Payer: Self-pay | Admitting: Podiatry

## 2013-11-28 VITALS — BP 119/76 | HR 84 | Resp 16

## 2013-11-28 DIAGNOSIS — M2040 Other hammer toe(s) (acquired), unspecified foot: Secondary | ICD-10-CM

## 2013-11-28 DIAGNOSIS — M21619 Bunion of unspecified foot: Secondary | ICD-10-CM

## 2013-11-28 DIAGNOSIS — M898X9 Other specified disorders of bone, unspecified site: Secondary | ICD-10-CM

## 2013-11-28 NOTE — Progress Notes (Signed)
Subjective:     Patient ID: Molly [name], female   DOB: 28-Aug-1945, 68 y.o.   MRN: [id number]  Foot Pain   patient presents stating I'm having pain between the big toe second toe and third and fourth toes on the right foot that makes it hard for me to walk exercise or wear any kind of shoe gear   Review of Systems  All other systems reviewed and are negative.      Objective:   Physical Exam  Nursing note and vitals reviewed. Constitutional: She is oriented to person, place, and time.  Cardiovascular: Intact distal pulses.   Musculoskeletal: Normal range of motion.  Neurological: She is oriented to person, place, and time.  Skin: Skin is warm.   neurovascular status intact with muscle strength adequate and range of motion subtalar midtarsal joint within normal limits. 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 Discussed treatment and applied padding to the painful areas. Discussed derotational arthroplasty fourth right arthroplasty third right and structural bunion correction right foot. She wants to have this done will call to schedule and we'll seen back for consult prior to surgery

## 2013-11-28 NOTE — Progress Notes (Signed)
   Subjective:    Patient ID: Molly [name], female    DOB: Jan 24, 1946, 68 y.o.   MRN: [id number]  HPI Comments: i had pain in my big toe on my rt foot only one time. i had a corn in between my 3rd and 4th toes on my rt foot. This has been going on since February or march 2015. It hurts toe walk, i cant do it. The toes have gotten better. i have used corn pads on my toes, lotion, hot shower, and clean my toes. i took diclofenac for my toes.  Foot Pain      Review of Systems  Respiratory: Positive for wheezing.   Musculoskeletal: Positive for back pain.  All other systems reviewed and are negative.      Objective:   Physical Exam        Assessment & Plan:

## 2013-12-26 ENCOUNTER — Ambulatory Visit: Payer: Medicare Other | Admitting: Podiatry

## 2013-12-26 ENCOUNTER — Encounter: Payer: Self-pay | Admitting: Podiatry

## 2013-12-26 ENCOUNTER — Ambulatory Visit (INDEPENDENT_AMBULATORY_CARE_PROVIDER_SITE_OTHER): Payer: Medicare Other | Admitting: Podiatry

## 2013-12-26 ENCOUNTER — Institutional Professional Consult (permissible substitution): Payer: Medicare Other | Admitting: Podiatry

## 2013-12-26 VITALS — BP 128/74 | HR 74 | Resp 16

## 2013-12-26 DIAGNOSIS — M2040 Other hammer toe(s) (acquired), unspecified foot: Secondary | ICD-10-CM

## 2013-12-26 DIAGNOSIS — M21619 Bunion of unspecified foot: Secondary | ICD-10-CM

## 2013-12-26 DIAGNOSIS — M2010 Hallux valgus (acquired), unspecified foot: Secondary | ICD-10-CM

## 2013-12-26 MED ORDER — MEPERIDINE HCL 50 MG PO TABS: 50.0000 mg | ORAL_TABLET | ORAL | Status: AC | PRN

## 2013-12-26 MED ORDER — PROMETHAZINE HCL 25 MG PO TABS: 25.0000 mg | ORAL_TABLET | Freq: Four times a day (QID) | ORAL | Status: AC | PRN

## 2013-12-26 NOTE — Patient Instructions (Signed)
Pre-Operative Instructions  Congratulations, you have decided to take an important step to improving your quality of life.  You can be assured that the doctors of [facility] will be with you every step of the way.  1. Plan to be at the surgery center/hospital at least 1 (one) hour prior to your scheduled time unless otherwise directed by the surgical center/hospital staff.  You must have a responsible adult accompany you, remain during the surgery and drive you home.  Make sure you have directions to the surgical center/hospital and know how to get there on time. 2. For hospital based surgery you will need to obtain a history and physical form from your family physician within 1 month prior to the date of surgery- we will give you a form for you primary physician.  3. We make every effort to accommodate the date you request for surgery.  There are however, times where surgery dates or times have to be moved.  We will contact you as soon as possible if a change in schedule is required.   4. No Aspirin/Ibuprofen for one week before surgery.  If you are on aspirin, any non-steroidal anti-inflammatory medications (Mobic, Aleve, Ibuprofen) you should stop taking it 7 days prior to your surgery.  You make take Tylenol  For pain prior to surgery.  5. Medications- If you are taking daily heart and blood pressure medications, seizure, reflux, allergy, asthma, anxiety, pain or diabetes medications, make sure the surgery center/hospital is aware before the day of surgery so they may notify you which medications to take or avoid the day of surgery. 6. No food or drink after midnight the night before surgery unless directed otherwise by surgical center/hospital staff. 7. No alcoholic beverages 24 hours prior to surgery.  No smoking 24 hours prior to or 24 hours after surgery. 8. Wear loose pants or shorts- loose enough to fit over bandages, boots, and casts. 9. No slip on shoes, sneakers are best. 10. Bring  your boot with you to the surgery center/hospital.  Also bring crutches or a walker if your physician has prescribed it for you.  If you do not have this equipment, it will be provided for you after surgery. 11. If you have not been contracted by the surgery center/hospital by the day before your surgery, call to confirm the date and time of your surgery. 12. Leave-time from work may vary depending on the type of surgery you have.  Appropriate arrangements should be made prior to surgery with your employer. 13. Prescriptions will be provided immediately following surgery by your doctor.  Have these filled as soon as possible after surgery and take the medication as directed. 14. Remove nail polish on the operative foot. 15. Wash the night before surgery.  The night before surgery wash the foot and leg well with the antibacterial soap provided and water paying special attention to beneath the toenails and in between the toes.  Rinse thoroughly with water and dry well with a towel.  Perform this wash unless told not to do so by your physician.  Enclosed: 1 Ice pack (please put in freezer the night before surgery)   1 Hibiclens skin cleaner   Pre-op Instructions  If you have any questions regarding the instructions, do not hesitate to call our office.  [place]: [address] [place], [place] [address] [phone]  [place]: [address] , [place] [address] [phone]  [place]: [address]  [place], [place] [address] [phone]  Dr. Richard   Tuchman DPM, Dr. Norman Regal DPM Dr. Richard Sikora DPM, Dr. M. Todd Hyatt DPM, Dr. Kathryn Egerton DPM 

## 2013-12-28 NOTE — Progress Notes (Signed)
Subjective:     Patient ID: Molly SouthwardMary R Stencil, female   DOB: 11/02/1945, 68 y.o.   MRN: [id number]  HPI patient presents for correction of her right foot consultation wanting to review the procedures we will be doing   Review of Systems     Objective:   Physical Exam Neurovascular status is found to be intact with structural deformity right over left with redness around the first metatarsal that's painful and deviation of the third and fourth toes with lesion formation and pain upon ambulation. 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 Patient wants procedures and understands risk and at this time I allowed her to read consent form line byline with described procedures explain the risk associated with them and the fact that total recovery. We'll take 6 months to one year. Patient is dispensed a air fracture walker with all instructions on usage and given all preoperative information is encouraged to call with any questions

## 2014-01-14 DIAGNOSIS — M2040 Other hammer toe(s) (acquired), unspecified foot: Secondary | ICD-10-CM

## 2014-01-14 DIAGNOSIS — M2010 Hallux valgus (acquired), unspecified foot: Secondary | ICD-10-CM

## 2014-01-15 ENCOUNTER — Encounter: Payer: Self-pay | Admitting: Podiatry

## 2014-01-15 ENCOUNTER — Telehealth: Payer: Self-pay

## 2014-01-15 NOTE — Telephone Encounter (Signed)
Spoke with pt reagrding post operative status, she states that she is managing her pain effectively and has no concerns or questions at the moment. Advise to ice, elevate, remain in boot and keep dressing dry.

## 2014-01-15 NOTE — Progress Notes (Signed)
Dr Paulla Dolly performed a right [place] with hammertoe repair of 3,4 right met on 01/14/14. Prescribed Demerol 66m #35 1-2 tabs Q 4-6hrs prn pain. Phenergan 233m#35 1- tabs Q4-6 hrs prn nausea

## 2014-01-20 ENCOUNTER — Ambulatory Visit (INDEPENDENT_AMBULATORY_CARE_PROVIDER_SITE_OTHER): Payer: Medicare Other

## 2014-01-20 ENCOUNTER — Encounter: Payer: Self-pay | Admitting: Podiatry

## 2014-01-20 ENCOUNTER — Ambulatory Visit (INDEPENDENT_AMBULATORY_CARE_PROVIDER_SITE_OTHER): Payer: Medicare Other | Admitting: Podiatry

## 2014-01-20 VITALS — BP 112/62 | HR 91 | Resp 16

## 2014-01-20 DIAGNOSIS — M2010 Hallux valgus (acquired), unspecified foot: Secondary | ICD-10-CM

## 2014-01-20 DIAGNOSIS — M2011 Hallux valgus (acquired), right foot: Secondary | ICD-10-CM

## 2014-01-20 DIAGNOSIS — M2040 Other hammer toe(s) (acquired), unspecified foot: Secondary | ICD-10-CM

## 2014-01-20 NOTE — Progress Notes (Signed)
Subjective:     Patient ID: Ulyses SouthwardMary R Howser, female   DOB: 05/05/1946, 68 y.o.   MRN: [id number]  HPI patient states I'm doing well with my right foot and able to walk without much pain and it will swell a little bit at the end of the day   Review of Systems     Objective:   Physical Exam Neurovascular status intact with negative Homans sign noted in digits found to be well perfused. Incision sites are healed well with good alignment of the first metatarsal and stitches in place third fourth toes with wound edges well coapted and no indications of drainage    Assessment:     Doing well post bunion hammertoe correction right    Plan:     H&P discussed and sterile dressing reapplied with review of x-ray. Instructed on importance of continued immobilization and dispensed surgical shoe and reappoint in 2 weeks for stitch removal and reevaluation earlier if any issues should occur

## 2014-02-03 ENCOUNTER — Ambulatory Visit (INDEPENDENT_AMBULATORY_CARE_PROVIDER_SITE_OTHER): Payer: Medicare Other | Admitting: Podiatry

## 2014-02-03 DIAGNOSIS — M2011 Hallux valgus (acquired), right foot: Secondary | ICD-10-CM

## 2014-02-03 DIAGNOSIS — M2010 Hallux valgus (acquired), unspecified foot: Secondary | ICD-10-CM

## 2014-02-03 DIAGNOSIS — M2040 Other hammer toe(s) (acquired), unspecified foot: Secondary | ICD-10-CM

## 2014-02-03 NOTE — Progress Notes (Signed)
   Subjective:    Patient ID: Molly SouthwardMary R Grothe, female    DOB: 12/14/1945, 68 y.o.   MRN: [id number]  HPI Pt presents for removal of sutures , DOS 01/14/14. Sutures removed, wound edges approximated and aligned, no drainage, noted mild swelling    Review of Systems     Objective:   Physical Exam        Assessment & Plan:

## 2014-02-03 NOTE — Progress Notes (Signed)
Subjective:     Patient ID: Molly SouthwardMary R [name], female   DOB: 06/03/1946, 68 y.o.   MRN: [id number]  HPI patient presents stating she's doing really well and able to walk without discomfort at the current time   Review of Systems     Objective:   Physical Exam Neurovascular status intact with well-healed first metatarsal and third and fourth toe digital sites right foot with stitches intact and wound edges well coapted    Assessment:     Doing well post bunionectomy hammertoe repair    Plan:     Stitches removed wound is made coapted well dispensed anklet with instructions on usage along with range of motion exercises reappoint 4 weeks earlier if any issues should occur

## 2014-03-10 ENCOUNTER — Ambulatory Visit (INDEPENDENT_AMBULATORY_CARE_PROVIDER_SITE_OTHER): Payer: Medicare Other

## 2014-03-10 DIAGNOSIS — M2010 Hallux valgus (acquired), unspecified foot: Secondary | ICD-10-CM

## 2014-03-10 DIAGNOSIS — Z9889 Other specified postprocedural states: Secondary | ICD-10-CM

## 2014-03-10 DIAGNOSIS — M2011 Hallux valgus (acquired), right foot: Secondary | ICD-10-CM

## 2014-03-10 NOTE — Progress Notes (Signed)
   Subjective:    Patient ID: Molly [name], female    DOB: Jan 02, 1946, 68 y.o.   MRN: [id number]  HPI  Pt presents for post op visit, DOS 01/14/14. States that she is having some new, mild pain in 2,3,4 met recently. Xray done  Review of Systems     Objective:   Physical Exam  Noted good range of motion in surgical foot, very mild swelling, no erythema, pt is afebrile.       Assessment & Plan:  Advised pt to wear supportive shoes, and ice and elevate when needed, Should any problems arise she is to call the office.

## 2014-10-15 ENCOUNTER — Other Ambulatory Visit: Payer: Self-pay | Admitting: Internal Medicine

## 2014-10-15 DIAGNOSIS — Z1231 Encounter for screening mammogram for malignant neoplasm of breast: Secondary | ICD-10-CM

## 2014-10-23 ENCOUNTER — Ambulatory Visit
Admission: RE | Admit: 2014-10-23 | Discharge: 2014-10-23 | Disposition: A | Discharge status: Home / Self Care | Payer: Medicare Other | Source: Ambulatory Visit | Attending: Internal Medicine | Admitting: Internal Medicine

## 2014-10-23 DIAGNOSIS — Z1231 Encounter for screening mammogram for malignant neoplasm of breast: Secondary | ICD-10-CM

## 2015-11-06 ENCOUNTER — Other Ambulatory Visit: Payer: Self-pay

## 2015-11-06 ENCOUNTER — Ambulatory Visit
Admission: RE | Admit: 2015-11-06 | Discharge: 2015-11-06 | Disposition: A | Discharge status: Home / Self Care | Payer: Medicare Other | Source: Ambulatory Visit

## 2015-11-06 DIAGNOSIS — Z1231 Encounter for screening mammogram for malignant neoplasm of breast: Secondary | ICD-10-CM

## 2015-11-09 ENCOUNTER — Other Ambulatory Visit: Payer: Self-pay | Admitting: Internal Medicine

## 2015-11-09 DIAGNOSIS — R928 Other abnormal and inconclusive findings on diagnostic imaging of breast: Secondary | ICD-10-CM

## 2015-11-17 ENCOUNTER — Ambulatory Visit
Admission: RE | Admit: 2015-11-17 | Discharge: 2015-11-17 | Disposition: A | Discharge status: Home / Self Care | Payer: Medicare Other | Source: Ambulatory Visit | Attending: Internal Medicine | Admitting: Internal Medicine

## 2015-11-17 DIAGNOSIS — R928 Other abnormal and inconclusive findings on diagnostic imaging of breast: Secondary | ICD-10-CM

## 2016-10-21 ENCOUNTER — Other Ambulatory Visit: Payer: Self-pay | Admitting: Internal Medicine

## 2016-10-21 DIAGNOSIS — Z1231 Encounter for screening mammogram for malignant neoplasm of breast: Secondary | ICD-10-CM

## 2016-11-17 ENCOUNTER — Ambulatory Visit
Admission: RE | Admit: 2016-11-17 | Discharge: 2016-11-17 | Disposition: A | Discharge status: Home / Self Care | Payer: Medicare Other | Source: Ambulatory Visit | Attending: Internal Medicine | Admitting: Internal Medicine

## 2016-11-17 DIAGNOSIS — Z1231 Encounter for screening mammogram for malignant neoplasm of breast: Secondary | ICD-10-CM

## 2017-11-22 ENCOUNTER — Other Ambulatory Visit: Payer: Self-pay | Admitting: Internal Medicine

## 2017-11-22 DIAGNOSIS — Z1231 Encounter for screening mammogram for malignant neoplasm of breast: Secondary | ICD-10-CM

## 2018-01-12 ENCOUNTER — Ambulatory Visit
Admission: RE | Admit: 2018-01-12 | Discharge: 2018-01-12 | Disposition: A | Discharge status: Home / Self Care | Payer: Medicare Other | Source: Ambulatory Visit | Attending: Internal Medicine | Admitting: Internal Medicine

## 2018-01-12 DIAGNOSIS — Z1231 Encounter for screening mammogram for malignant neoplasm of breast: Secondary | ICD-10-CM

## 2020-01-07 ENCOUNTER — Other Ambulatory Visit: Payer: Self-pay | Admitting: Family Medicine

## 2020-01-07 DIAGNOSIS — Z1231 Encounter for screening mammogram for malignant neoplasm of breast: Secondary | ICD-10-CM

## 2020-01-17 ENCOUNTER — Ambulatory Visit
Admission: RE | Admit: 2020-01-17 | Discharge: 2020-01-17 | Disposition: A | Discharge status: Home / Self Care | Payer: Medicare Other | Source: Ambulatory Visit | Attending: Internal Medicine | Admitting: Internal Medicine

## 2020-01-17 ENCOUNTER — Other Ambulatory Visit: Payer: Self-pay

## 2020-01-17 DIAGNOSIS — Z1231 Encounter for screening mammogram for malignant neoplasm of breast: Secondary | ICD-10-CM

## 2022-08-01 ENCOUNTER — Other Ambulatory Visit: Payer: Self-pay | Admitting: Internal Medicine

## 2022-08-01 DIAGNOSIS — Z1239 Encounter for other screening for malignant neoplasm of breast: Secondary | ICD-10-CM

## 2022-08-02 ENCOUNTER — Ambulatory Visit
Admission: RE | Admit: 2022-08-02 | Discharge: 2022-08-02 | Disposition: A | Discharge status: Home / Self Care | Payer: Medicare Other | Source: Ambulatory Visit | Attending: Internal Medicine | Admitting: Internal Medicine

## 2022-08-02 DIAGNOSIS — Z1239 Encounter for other screening for malignant neoplasm of breast: Secondary | ICD-10-CM
